# Patient Record
Sex: Female | Born: 1970 | Race: White | Hispanic: No | Marital: Married
Health system: Southern US, Community
[De-identification: ages and names within clinical notes are randomized; demographics above are authoritative.]

---

## 1997-06-21 ENCOUNTER — Encounter: Admission: RE | Admit: 1997-06-21 | Discharge: 1997-06-21 | Payer: Self-pay | Admitting: Obstetrics

## 1997-06-21 ENCOUNTER — Other Ambulatory Visit: Admission: RE | Admit: 1997-06-21 | Discharge: 1997-06-21 | Payer: Self-pay | Admitting: Obstetrics

## 1997-08-23 ENCOUNTER — Encounter: Admission: RE | Admit: 1997-08-23 | Discharge: 1997-08-23 | Payer: Self-pay | Admitting: Obstetrics

## 1997-08-29 ENCOUNTER — Emergency Department (HOSPITAL_COMMUNITY): Admission: EM | Admit: 1997-08-29 | Discharge: 1997-08-29 | Payer: Self-pay | Admitting: Emergency Medicine

## 1997-09-07 ENCOUNTER — Ambulatory Visit (HOSPITAL_COMMUNITY): Admission: RE | Admit: 1997-09-07 | Discharge: 1997-09-07 | Payer: Self-pay | Admitting: Internal Medicine

## 1997-09-12 ENCOUNTER — Ambulatory Visit (HOSPITAL_COMMUNITY): Admission: RE | Admit: 1997-09-12 | Discharge: 1997-09-12 | Payer: Self-pay | Admitting: Internal Medicine

## 1997-09-15 ENCOUNTER — Inpatient Hospital Stay (HOSPITAL_COMMUNITY): Admission: EM | Admit: 1997-09-15 | Discharge: 1997-09-16 | Payer: Self-pay | Admitting: Emergency Medicine

## 1997-10-11 ENCOUNTER — Other Ambulatory Visit: Admission: RE | Admit: 1997-10-11 | Discharge: 1997-10-11 | Payer: Self-pay | Admitting: Obstetrics

## 1997-10-11 ENCOUNTER — Encounter: Admission: RE | Admit: 1997-10-11 | Discharge: 1997-10-11 | Payer: Self-pay | Admitting: Obstetrics

## 1997-11-30 ENCOUNTER — Encounter: Payer: Self-pay | Admitting: Emergency Medicine

## 1997-11-30 ENCOUNTER — Emergency Department (HOSPITAL_COMMUNITY): Admission: EM | Admit: 1997-11-30 | Discharge: 1997-11-30 | Payer: Self-pay | Admitting: Emergency Medicine

## 1998-12-06 ENCOUNTER — Encounter: Admission: RE | Admit: 1998-12-06 | Discharge: 1998-12-06 | Payer: Self-pay | Admitting: Internal Medicine

## 1998-12-06 ENCOUNTER — Encounter: Payer: Self-pay | Admitting: Internal Medicine

## 1998-12-16 ENCOUNTER — Ambulatory Visit (HOSPITAL_COMMUNITY): Admission: RE | Admit: 1998-12-16 | Discharge: 1998-12-16 | Payer: Self-pay | Admitting: Internal Medicine

## 1998-12-16 ENCOUNTER — Encounter: Payer: Self-pay | Admitting: Internal Medicine

## 1998-12-19 ENCOUNTER — Encounter: Admission: RE | Admit: 1998-12-19 | Discharge: 1998-12-19 | Payer: Self-pay | Admitting: Obstetrics

## 1999-01-30 ENCOUNTER — Encounter: Admission: RE | Admit: 1999-01-30 | Discharge: 1999-01-30 | Payer: Self-pay | Admitting: Obstetrics

## 2000-05-17 ENCOUNTER — Emergency Department (HOSPITAL_COMMUNITY): Admission: EM | Admit: 2000-05-17 | Discharge: 2000-05-17 | Payer: Self-pay | Admitting: Emergency Medicine

## 2000-05-17 ENCOUNTER — Encounter: Payer: Self-pay | Admitting: Emergency Medicine

## 2001-07-28 ENCOUNTER — Encounter: Admission: RE | Admit: 2001-07-28 | Discharge: 2001-07-28 | Payer: Self-pay | Admitting: Obstetrics and Gynecology

## 2001-08-18 ENCOUNTER — Ambulatory Visit (HOSPITAL_COMMUNITY): Admission: RE | Admit: 2001-08-18 | Discharge: 2001-08-18 | Payer: Self-pay | Admitting: Obstetrics and Gynecology

## 2001-08-18 ENCOUNTER — Encounter: Payer: Self-pay | Admitting: Obstetrics and Gynecology

## 2004-06-12 ENCOUNTER — Ambulatory Visit (HOSPITAL_COMMUNITY): Admission: RE | Admit: 2004-06-12 | Discharge: 2004-06-12 | Payer: Self-pay | Admitting: Obstetrics and Gynecology

## 2004-08-05 ENCOUNTER — Ambulatory Visit: Payer: Self-pay | Admitting: Obstetrics and Gynecology

## 2004-09-02 ENCOUNTER — Ambulatory Visit: Payer: Self-pay | Admitting: Obstetrics & Gynecology

## 2004-11-03 ENCOUNTER — Ambulatory Visit: Payer: Self-pay | Admitting: Obstetrics & Gynecology

## 2004-11-03 ENCOUNTER — Observation Stay (HOSPITAL_COMMUNITY): Admission: RE | Admit: 2004-11-03 | Discharge: 2004-11-03 | Payer: Self-pay | Admitting: Obstetrics & Gynecology

## 2004-11-07 ENCOUNTER — Ambulatory Visit: Payer: Self-pay | Admitting: Family Medicine

## 2004-11-09 ENCOUNTER — Ambulatory Visit: Payer: Self-pay | Admitting: Family Medicine

## 2004-11-09 ENCOUNTER — Inpatient Hospital Stay (HOSPITAL_COMMUNITY): Admission: AD | Admit: 2004-11-09 | Discharge: 2004-11-09 | Payer: Self-pay | Admitting: Family Medicine

## 2004-11-10 ENCOUNTER — Ambulatory Visit: Payer: Self-pay | Admitting: *Deleted

## 2004-11-10 ENCOUNTER — Inpatient Hospital Stay (HOSPITAL_COMMUNITY): Admission: AD | Admit: 2004-11-10 | Discharge: 2004-11-12 | Payer: Self-pay | Admitting: *Deleted

## 2004-11-18 ENCOUNTER — Ambulatory Visit: Payer: Self-pay | Admitting: *Deleted

## 2004-11-25 ENCOUNTER — Ambulatory Visit: Payer: Self-pay | Admitting: Obstetrics and Gynecology

## 2004-12-04 ENCOUNTER — Ambulatory Visit: Payer: Self-pay | Admitting: Obstetrics and Gynecology

## 2004-12-16 ENCOUNTER — Ambulatory Visit: Payer: Self-pay | Admitting: Obstetrics and Gynecology

## 2004-12-23 ENCOUNTER — Ambulatory Visit: Payer: Self-pay | Admitting: *Deleted

## 2005-01-01 ENCOUNTER — Ambulatory Visit: Payer: Self-pay | Admitting: Obstetrics and Gynecology

## 2005-04-06 ENCOUNTER — Ambulatory Visit (HOSPITAL_COMMUNITY): Admission: RE | Admit: 2005-04-06 | Discharge: 2005-04-06 | Payer: Self-pay | Admitting: Obstetrics

## 2005-05-20 ENCOUNTER — Encounter: Payer: Self-pay | Admitting: General Surgery

## 2005-09-20 ENCOUNTER — Emergency Department: Payer: Self-pay | Admitting: Unknown Physician Specialty

## 2005-10-02 ENCOUNTER — Emergency Department: Payer: Self-pay | Admitting: Emergency Medicine

## 2005-11-10 ENCOUNTER — Ambulatory Visit (HOSPITAL_COMMUNITY): Admission: RE | Admit: 2005-11-10 | Discharge: 2005-11-10 | Payer: Self-pay | Admitting: Obstetrics

## 2006-05-20 ENCOUNTER — Encounter: Admission: RE | Admit: 2006-05-20 | Discharge: 2006-08-18 | Payer: Self-pay | Admitting: Obstetrics

## 2006-06-20 ENCOUNTER — Emergency Department: Payer: Self-pay | Admitting: Emergency Medicine

## 2007-05-28 ENCOUNTER — Emergency Department: Payer: Self-pay | Admitting: Unknown Physician Specialty

## 2007-10-04 ENCOUNTER — Emergency Department: Payer: Self-pay | Admitting: Emergency Medicine

## 2008-04-02 ENCOUNTER — Emergency Department: Payer: Self-pay | Admitting: Emergency Medicine

## 2009-01-17 IMAGING — CT CT ABD-PELV W/O CM
1 of 2 series · 15 of 32 positions shown, 19 images · non-contrast
Comparison: none

REASON FOR EXAM: (1) llq pain; (2) llq pain
COMMENTS:

PROCEDURE:     CT  - CT ABDOMEN AND PELVIS W[DATE]  [DATE]
RESULT:     Comparison: No available comparison exam.
TECHNIQUE: CT examination of the abdomen and pelvis was performed without
contrast. Collimation is 3 mm.

[Series 2: abd/pelvis · axial · 0.72mm/px · z∈[+880,+1300]mm · 15 of 158 slices shown, 19 images]
[im 12/158  soft-tissue]
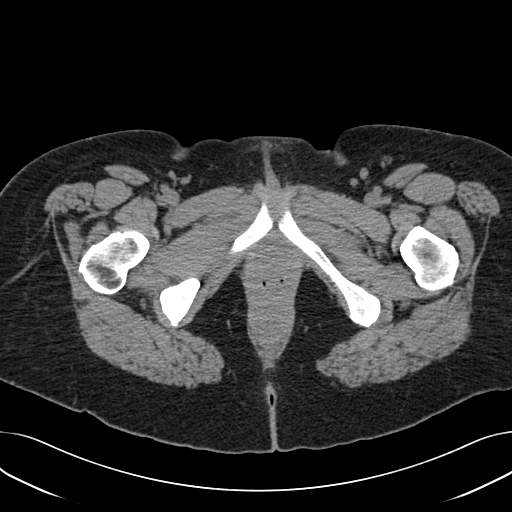
[im 12/158  bone]
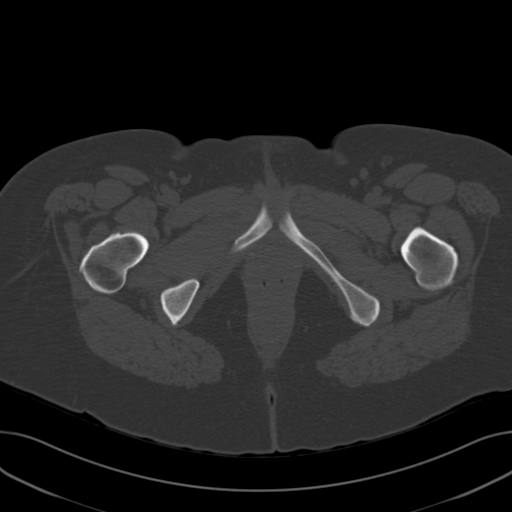
[im 23/158  soft-tissue]
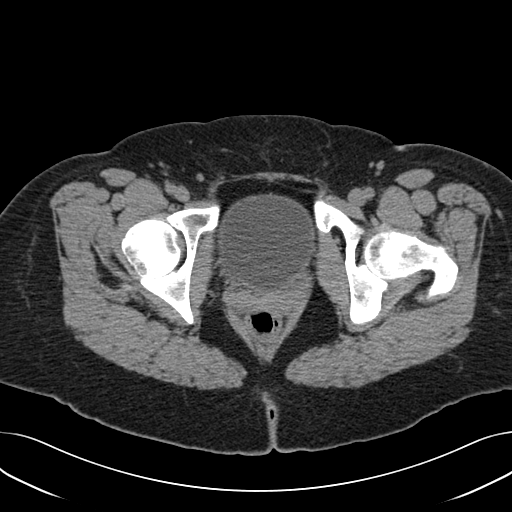
[im 34/158  soft-tissue]
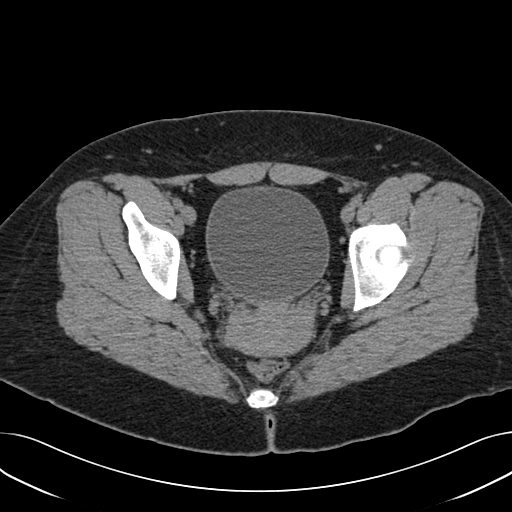
[im 45/158  soft-tissue]
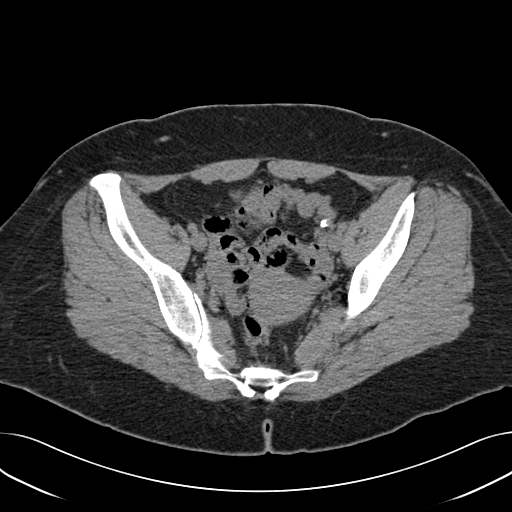
[im 57/158  soft-tissue]
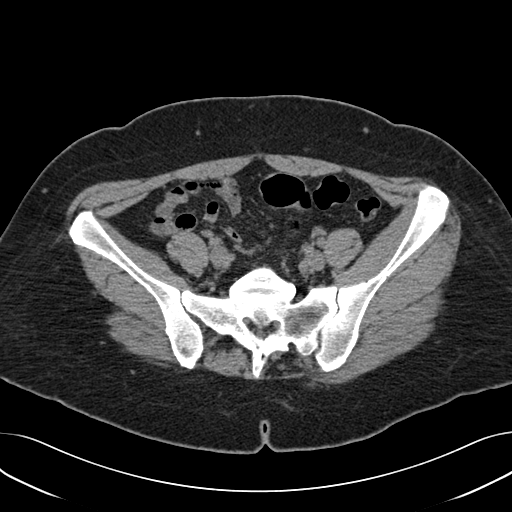
[im 68/158  soft-tissue]
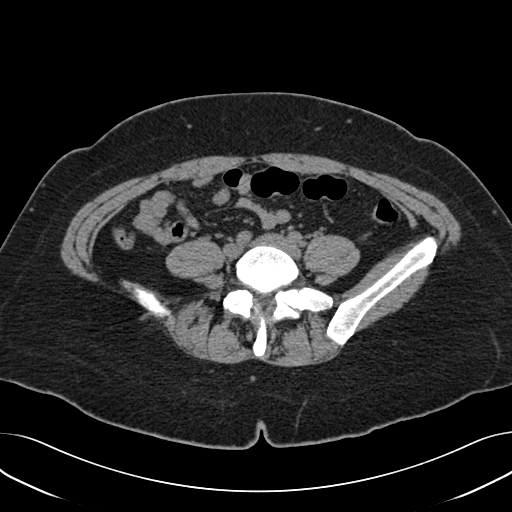
[im 79/158  soft-tissue]
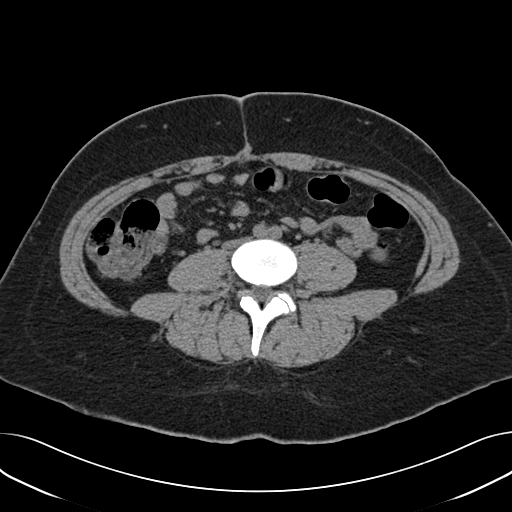
[im 90/158  soft-tissue]
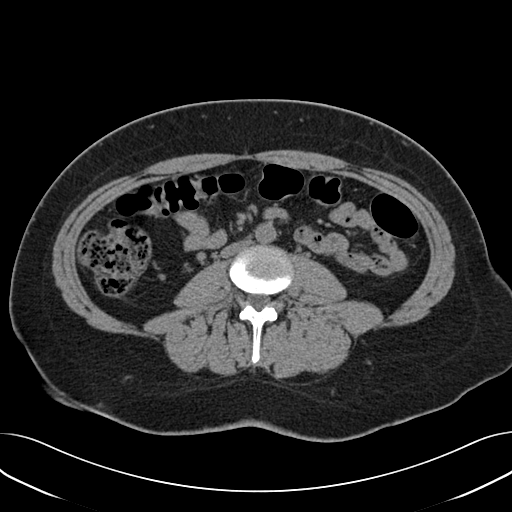
[im 101/158  soft-tissue]
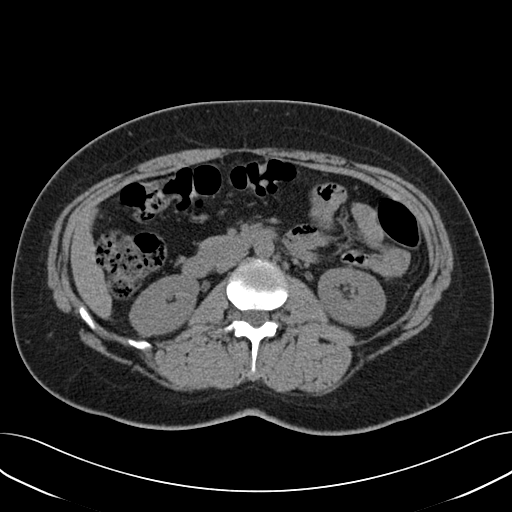
[im 101/158  bone]
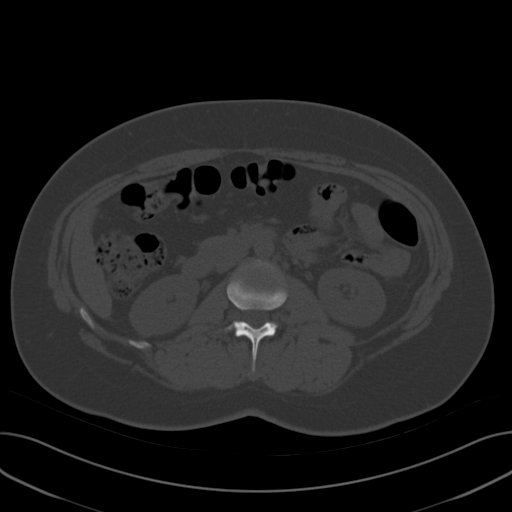
[im 113/158  soft-tissue]
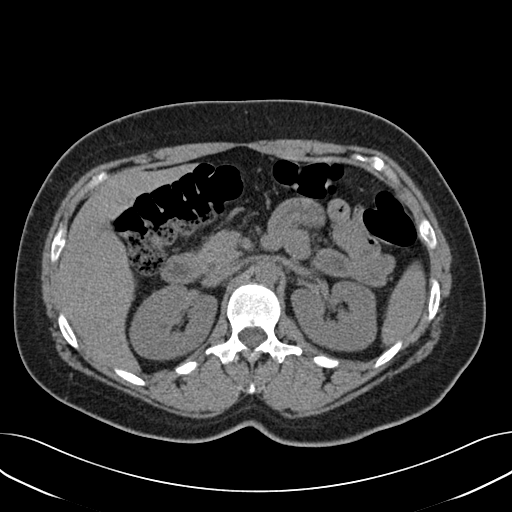
[im 124/158  soft-tissue]
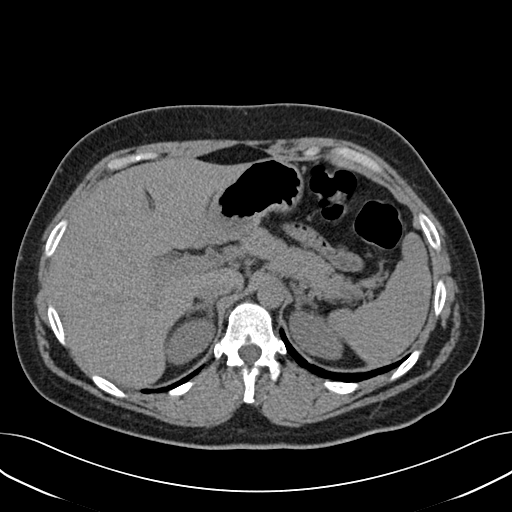
[im 135/158  soft-tissue]
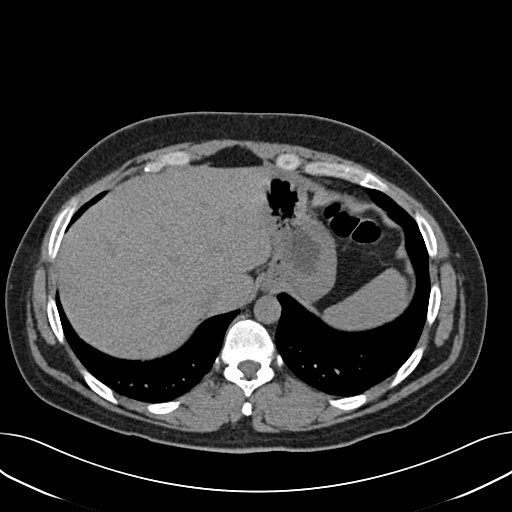
[im 135/158  lung]
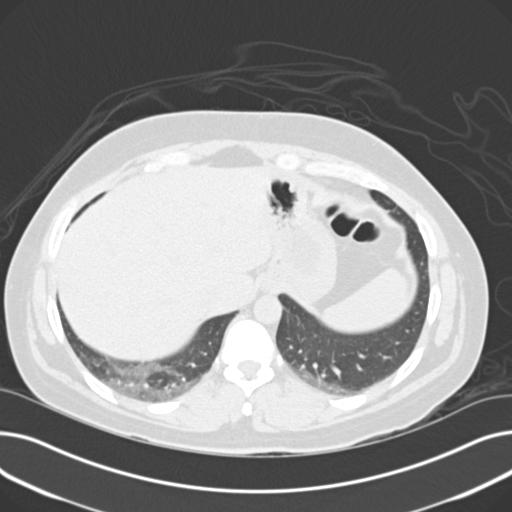
[im 141/158  lung]
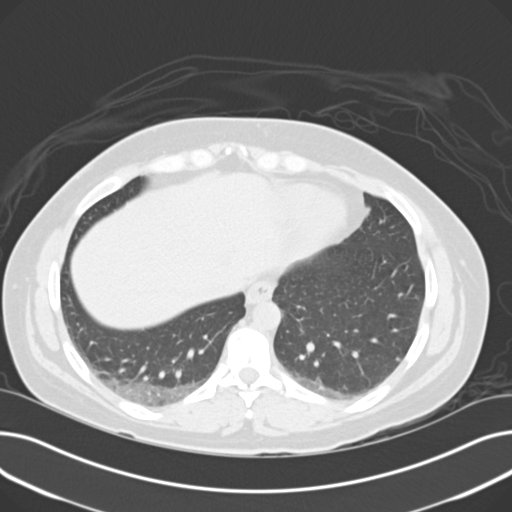
[im 146/158  soft-tissue]
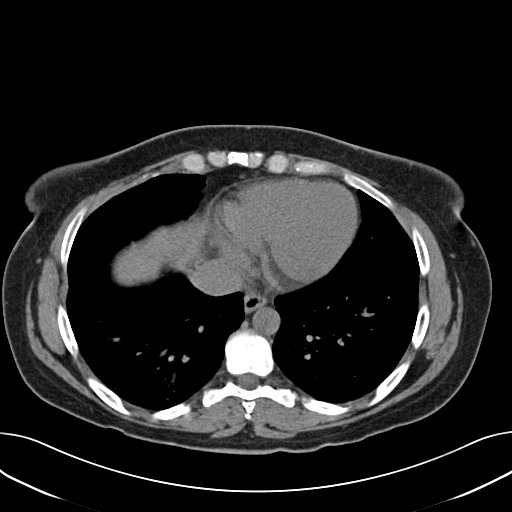
[im 146/158  lung]
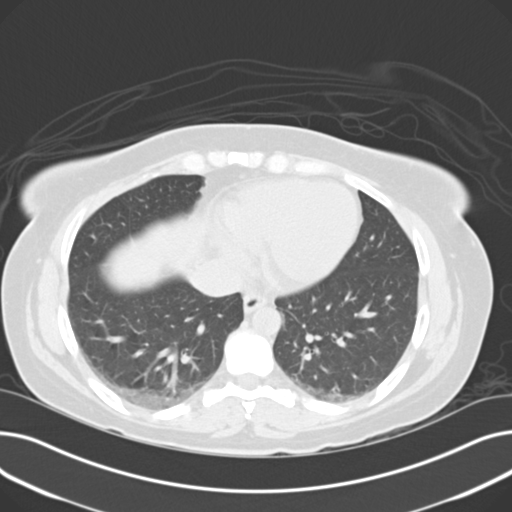
[im 152/158  lung]
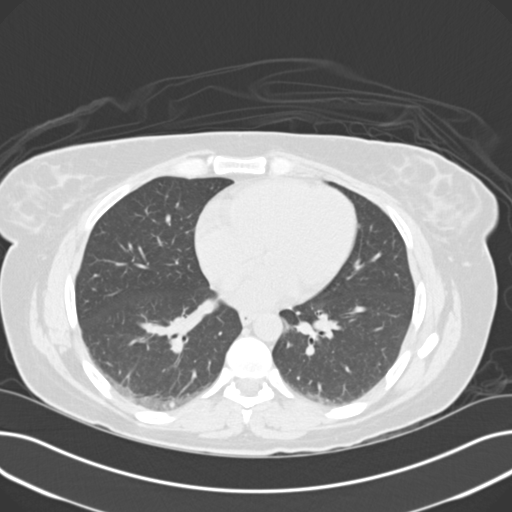

[15 of 32 positions shown; findings below may reference images not displayed]

FINDINGS: Limited evaluation of the lung bases shows mild bibasilar atelectasis.

Evaluation of the abdominal organs, bowels, and vessels is limited without
contrast. Patient is status post cholecystectomy. The liver, spleen,
pancreas, and right adrenal gland are grossly unremarkable. There may be an
1 cm left adrenal adenoma. No renal or ureteral stone is noted. There is no
hydronephrosis. There is an 8 mm exophytic right lower pole renal cyst.

There is no dilatation of the bowels. The appendix is unremarkable. There is
no significant intra abdominal or pelvic fat stranding. There is no
intraperitoneal free air. There is no significant free fluid. There are no
enlarged abdominal pelvic lymph nodes. The uterus is present. Bilateral
tubal ligation clips are noted.

Transitional vertebra is noted at the lumbosacral junction.
IMPRESSION: 1. No renal or ureteral stone.
2. Evaluation of the abdominal organs, bowels, and vessels is otherwise
limited without contrast. The appendix is unremarkable. There is no bowel
obstruction. There is no significant intra-abdominal or pelvic fat
stranding.

Preliminary report was faxed to the emergency room by the night radiologist
shortly after the study was performed.

## 2009-05-26 IMAGING — CT CT ABD-PELV W/ CM
1 of 2 series · 16 of 32 positions shown, 20 images · non-contrast
Comparison: none

REASON FOR EXAM: RLQ pain
COMMENTS:

PROCEDURE:     CT  - CT ABDOMEN / PELVIS  W  - October 04, 2007  [DATE]
RESULT:     The patient has a history of cholecystectomy, right lower
quadrant pain.
TECHNIQUE: IV and oral contrast enhanced CT of the abdomen and pelvis is
obtained.
Comparison is made to prior CT of 05/28/2007.

[Series 2: appendicitis · axial · 0.72mm/px · z∈[-274,+158]mm · 16 of 158 slices shown, 20 images]
[im 7/158  soft-tissue]
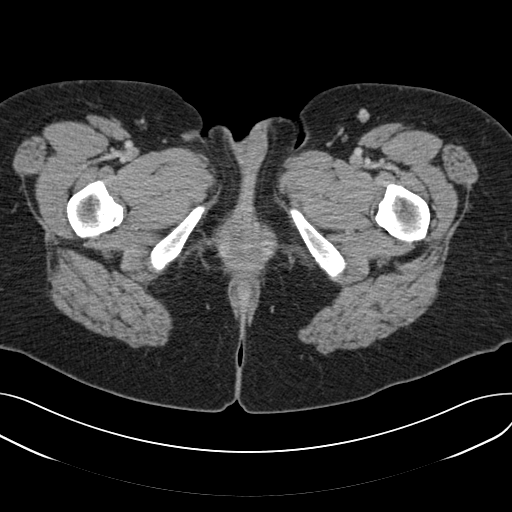
[im 7/158  bone]
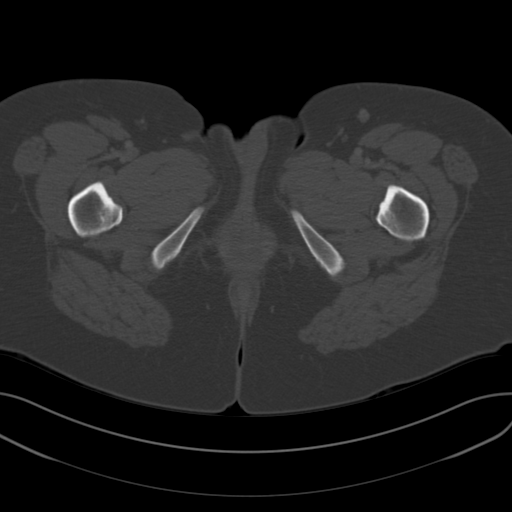
[im 19/158  soft-tissue]
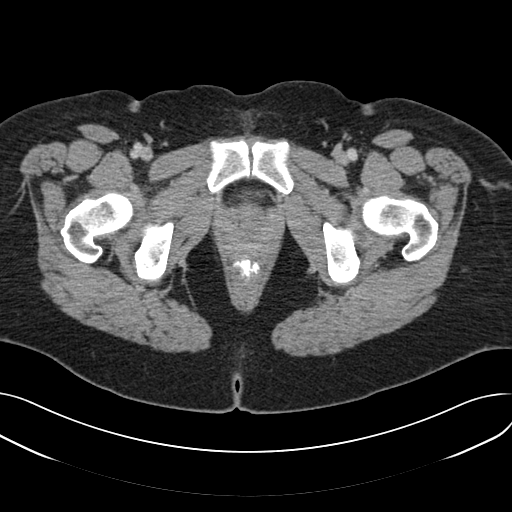
[im 31/158  soft-tissue]
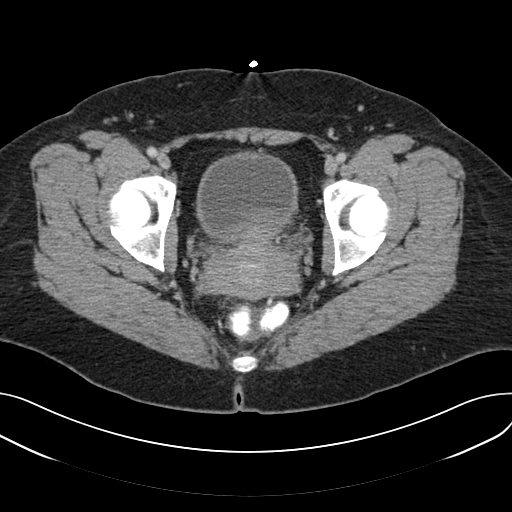
[im 43/158  soft-tissue]
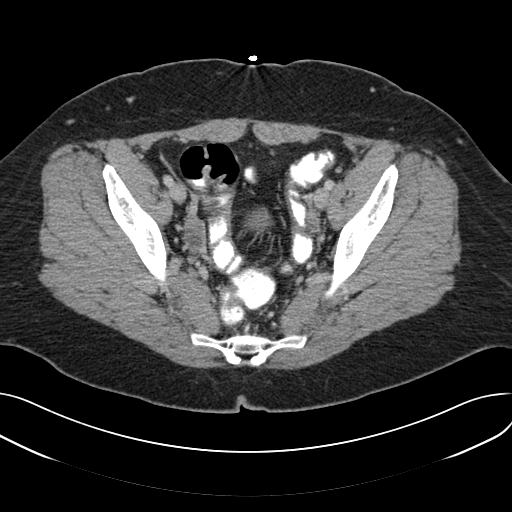
[im 55/158  soft-tissue]
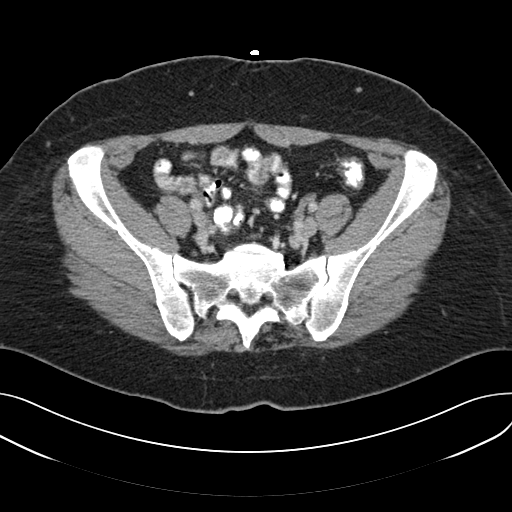
[im 61/158  soft-tissue]
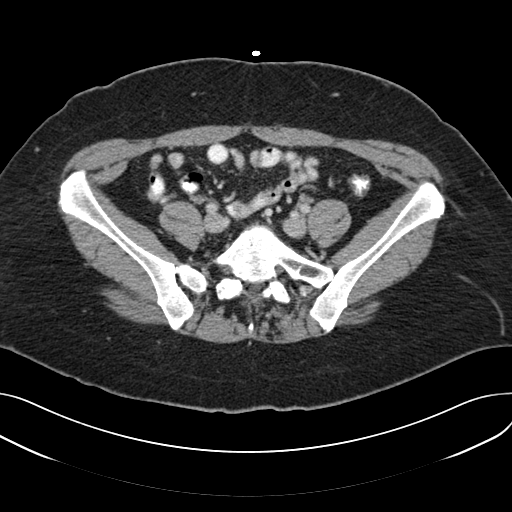
[im 73/158  soft-tissue]
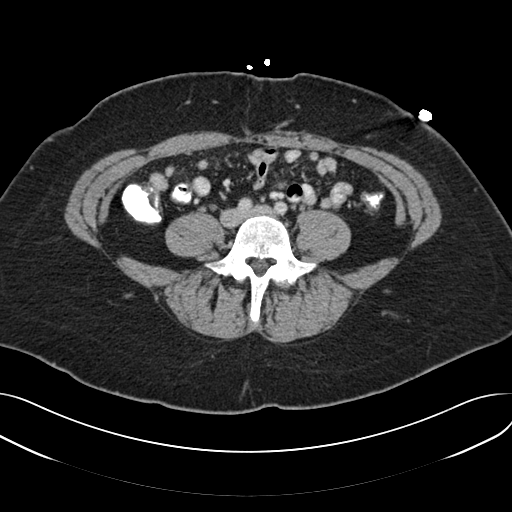
[im 85/158  soft-tissue]
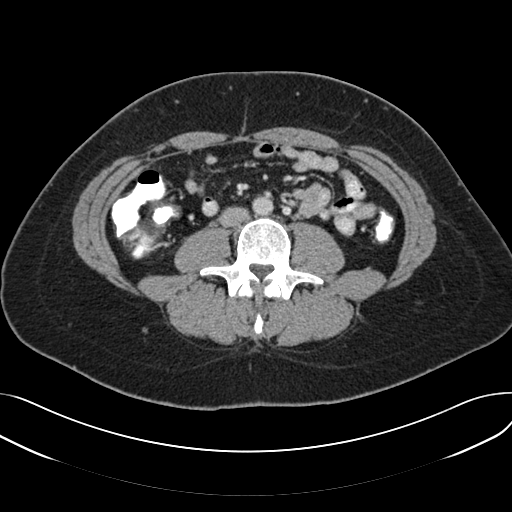
[im 97/158  soft-tissue]
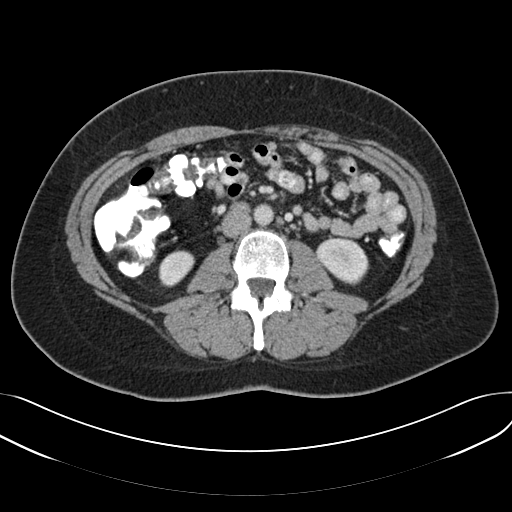
[im 97/158  bone]
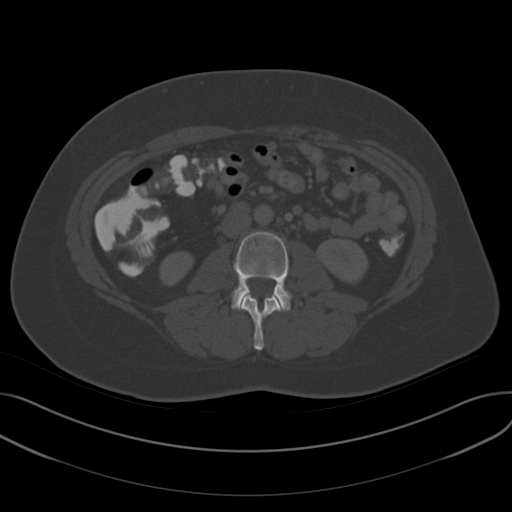
[im 103/158  soft-tissue]
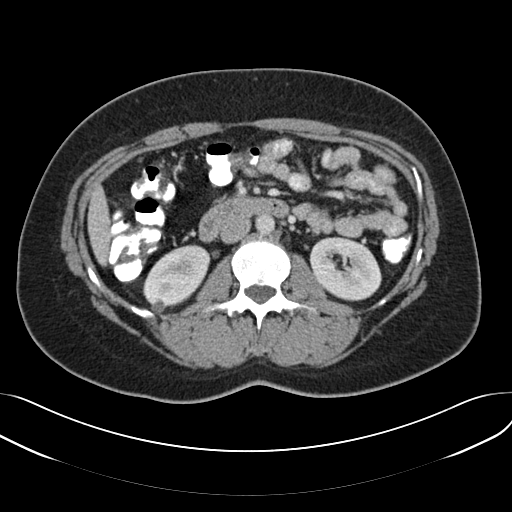
[im 115/158  soft-tissue]
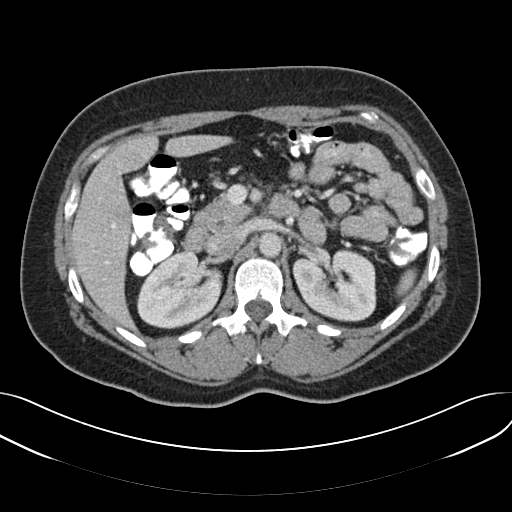
[im 127/158  soft-tissue]
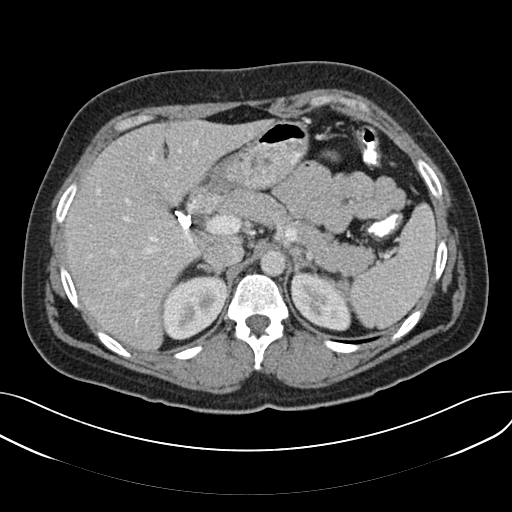
[im 133/158  lung]
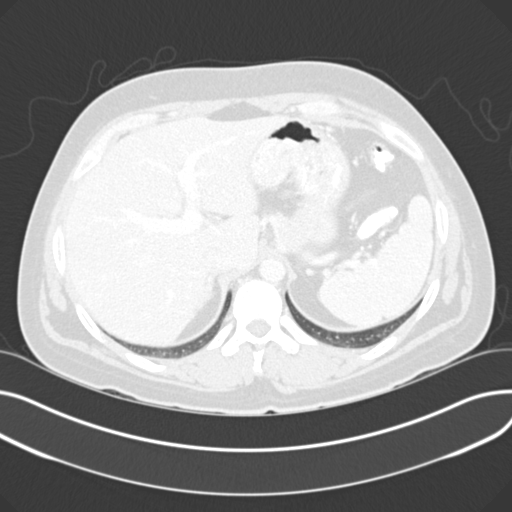
[im 139/158  soft-tissue]
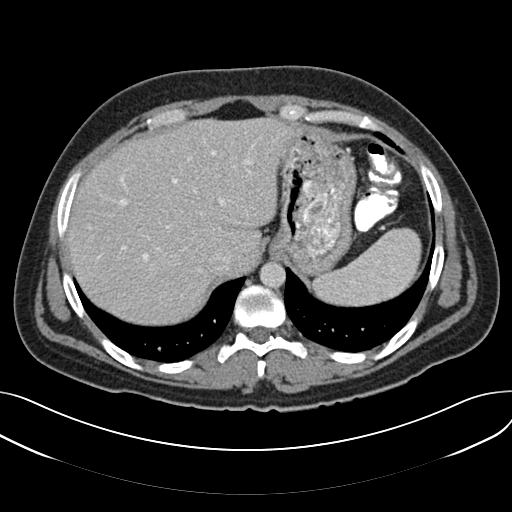
[im 139/158  lung]
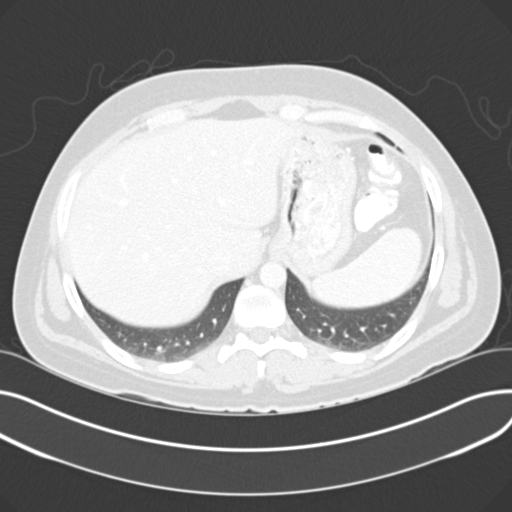
[im 145/158  lung]
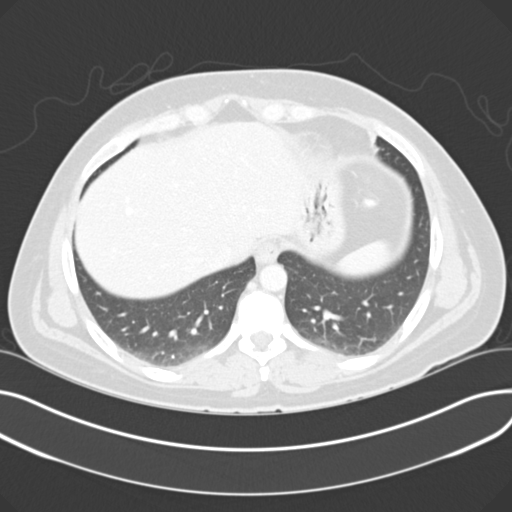
[im 151/158  soft-tissue]
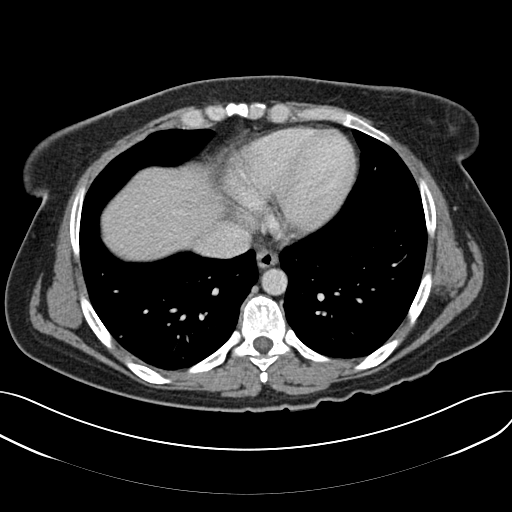
[im 151/158  lung]
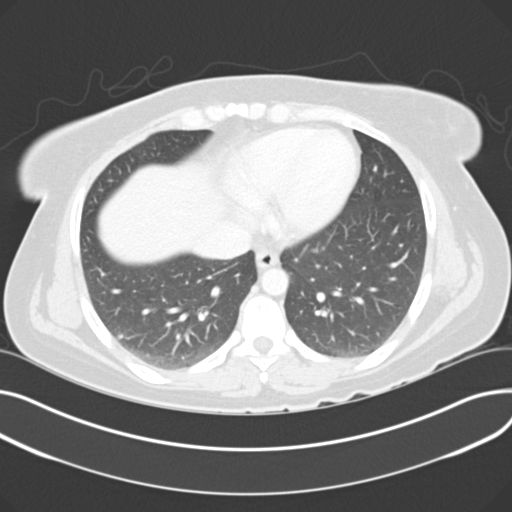

[16 of 32 positions shown; findings below may reference images not displayed]

FINDINGS: The liver and spleen are normal. The pancreas is normal. The
adrenals are normal. No focal renal abnormalities are identified. The
appendix is normal. No bowel distention is noted. A small pelvic fluid
collection is noted.
IMPRESSION: 1.     Small amount of pelvic fluid is noted. Ultrasound may prove useful
for further evaluation.
2.     No acute abnormality is otherwise noted.

## 2010-10-22 ENCOUNTER — Other Ambulatory Visit: Payer: Self-pay | Admitting: Family Medicine

## 2010-10-22 DIAGNOSIS — N6310 Unspecified lump in the right breast, unspecified quadrant: Secondary | ICD-10-CM

## 2010-11-04 ENCOUNTER — Ambulatory Visit
Admission: RE | Admit: 2010-11-04 | Discharge: 2010-11-04 | Disposition: A | Discharge status: Home / Self Care | Payer: Medicaid Other | Source: Ambulatory Visit | Attending: Family Medicine | Admitting: Family Medicine

## 2010-11-04 DIAGNOSIS — N6310 Unspecified lump in the right breast, unspecified quadrant: Secondary | ICD-10-CM

## 2020-01-25 ENCOUNTER — Other Ambulatory Visit: Payer: Self-pay

## 2020-01-25 ENCOUNTER — Emergency Department
Admission: EM | Admit: 2020-01-25 | Discharge: 2020-01-25 | Disposition: A | Discharge status: Home / Self Care | Payer: Self-pay | Attending: Emergency Medicine | Admitting: Emergency Medicine

## 2020-01-25 DIAGNOSIS — K029 Dental caries, unspecified: Secondary | ICD-10-CM | POA: Insufficient documentation

## 2020-01-25 DIAGNOSIS — K0889 Other specified disorders of teeth and supporting structures: Secondary | ICD-10-CM | POA: Insufficient documentation

## 2020-01-25 MED ORDER — CLINDAMYCIN HCL 150 MG PO CAPS
300.0000 mg | ORAL_CAPSULE | Freq: Once | ORAL | Status: AC
  Administered 2020-01-25: 300 mg via ORAL
  Filled 2020-01-25: qty 2

## 2020-01-25 MED ORDER — LIDOCAINE VISCOUS HCL 2 % MT SOLN
15.0000 mL | Freq: Once | OROMUCOSAL | Status: AC
  Administered 2020-01-25: 15 mL via OROMUCOSAL
  Filled 2020-01-25: qty 15

## 2020-01-25 MED ORDER — IBUPROFEN 600 MG PO TABS: 600.0000 mg | ORAL_TABLET | Freq: Three times a day (TID) | ORAL | 0 refills | Status: AC | PRN

## 2020-01-25 MED ORDER — CLINDAMYCIN HCL 150 MG PO CAPS: 300.0000 mg | ORAL_CAPSULE | Freq: Three times a day (TID) | ORAL | 0 refills | Status: AC

## 2020-01-25 MED ORDER — LIDOCAINE VISCOUS HCL 2 % MT SOLN: 15.0000 mL | OROMUCOSAL | 0 refills | Status: AC | PRN

## 2020-01-25 MED ORDER — IBUPROFEN 600 MG PO TABS
600.0000 mg | ORAL_TABLET | Freq: Once | ORAL | Status: AC
  Administered 2020-01-25: 600 mg via ORAL
  Filled 2020-01-25: qty 1

## 2020-01-25 NOTE — ED Triage Notes (Signed)
Pt to ED reporting right upper dental pain x 2 days ago with swelling. Hx of the same and PO antibiotics in the past. No fevers at home.

## 2020-01-25 NOTE — Discharge Instructions (Addendum)
OPTIONS FOR DENTAL FOLLOW UP CARE ° °[place] [facility] and [facility] - Local Safety Net Dental Clinics °http://www.ncdhhs.gov/dph/oralhealth/services/safetynetclinics.htm °  °[facility] (336-562-3123) ° °[facility] (919-933-9087) ° °[facility] (919-663-1744 ext [phone]) ° °[facility] ([phone]) ° °[facility] ([phone]) °This clinic caters to the indigent population and is on a lottery system. °Location: °[facility], [address], [place] °Clinic Hours: °Wednesdays from 6pm - 9pm, patients seen by a lottery system. °For dates, call or go to www.med.[facility].edu/[facility]/patients/Dental-[facility] °Services: °Cleanings, fillings and simple extractions. °Payment Options: °DENTAL WORK IS FREE OF CHARGE. Bring proof of income or support. °Best way to get seen: °Arrive at 5:15 pm - this is a lottery, NOT first come/first serve, so arriving earlier will not increase your chances of being seen. °  °  °[facility] °[phone] °Select option 1 for emergencies °  °Location: °[facility], [address], [place] °Clinic Hours: °No walk-ins accepted - call the day before to schedule an appointment. °Check in times are 9:30 am and 1:30 pm. °Services: °Simple extractions, temporary fillings, pulpectomy/pulp debridement, uncomplicated abscess drainage. °Payment Options: °PAYMENT IS DUE AT THE TIME OF SERVICE.  Fee is usually $100-200, additional surgical procedures (e.g. abscess drainage) may be extra. °Cash, checks, Visa/MasterCard accepted.  Can file Medicaid if patient is covered for dental - patient should call case worker to check. °No discount for [facility] patients. °Best way to get seen: °MUST call the day before and get onto the schedule. Can usually be seen the next 1-2 days. No walk-ins accepted. °  °  °[facility] °[phone] °   °Location: °[facility], [address], [place] °Clinic Hours: °M, W, Th, F 8am or 1:30pm, Tues 9a or 1:30 - first come/first served. °Services: °Simple extractions, temporary fillings, uncomplicated abscess drainage.  You do not need to be an [place] resident. °Payment Options: °PAYMENT IS DUE AT THE TIME OF SERVICE. °Dental insurance, otherwise sliding scale - bring proof of income or support. °Depending on income and treatment needed, cost is usually $50-200. °Best way to get seen: °Arrive early as it is first come/first served. °  °  °[facility] °[phone] °  °Location: °[address] °Clinic Hours: °Mon-Thu 8a-5p °Services: °Most basic dental services including extractions and fillings. °Payment Options: °PAYMENT IS DUE AT THE TIME OF SERVICE. °Sliding scale, up to 50% off - bring proof if income or support. °Medicaid with dental option accepted. °Best way to get seen: °Call to schedule an appointment, can usually be seen within 2 weeks OR they will try to see walk-ins - show up at 8a or 2p (you may have to wait). °  °  °[facility] °919-245-2435 °[place] RESIDENTS ONLY °  °Location: °[facility], [address], [place], [place] [address] °Clinic Hours: By appointment only. °Monday - Thursday 8am-5pm, Friday 8am-12pm °Services: Cleanings, fillings, extractions. °Payment Options: °PAYMENT IS DUE AT THE TIME OF SERVICE. °Cash, Visa or MasterCard. Sliding scale - $30 minimum per service. °Best way to get seen: °Come in to office, complete packet and make an appointment - need proof of income °or support monies for each household member and proof of [place] residence. °Usually takes about a month to get in. °  °  °[facility] °[phone] °  °Location: °[address]   [place] °Clinic Hours: Walk-in Urgent Care Dental Services are offered Monday-Friday  mornings only. °The numbers of emergencies accepted daily is limited to the number of °providers available. °Maximum 15 - Mondays, Wednesdays & Thursdays °Maximum 10 - Tuesdays & Fridays °Services: °You do not need to be a [place] resident to be seen for a dental emergency. °Emergencies are defined as pain, swelling, abnormal bleeding, or dental trauma. Walkins will receive x-rays if needed. °NOTE: Dental cleaning is not an emergency. °Payment Options: °PAYMENT IS DUE AT THE TIME OF SERVICE. °Minimum co-pay is $40.00 for uninsured patients. °Minimum co-pay is $3.00 for Medicaid with dental coverage. °Dental Insurance is accepted and must be presented at time of visit. °Medicare does not cover dental. °Forms of payment: Cash, credit card, checks. °Best way to get seen: °If not previously registered with the clinic, walk-in dental registration begins at 7:15 am and is on a first come/first serve basis. °If previously registered with the clinic, call to make an appointment. °  °  °[facility] °[phone] °[place] RESIDENTS ONLY °  °Location: °[address], [place], [place] °Clinic Hours: °Mon-Thu 10a-2p °Services: Extractions only! °Payment Options: °FREE (donations accepted) - bring proof of income or support °Best way to get seen: °Call and schedule an appointment OR come at 8am on the 1st Monday of every month (except for holidays) when it is first come/first served. °  °  °[facility] °[phone] °  °Location: °[address], [place] °Clinic Hours: °Friday mornings °Services, Payment Options, Best way to get seen: °Call for info °

## 2020-01-26 NOTE — ED Provider Notes (Signed)
[facility] Emergency Department Provider Note  ____________________________________________   Event Date/Time   First MD Initiated Contact with Patient 01/25/20 2053     (approximate)  I have reviewed the triage vital signs and the nursing notes.   HISTORY  Chief Complaint Dental Pain  HPI Tonya Donovan is a 50 y.o. female who reports to the emergency department for evaluation of pain to the upper right dental region for the last 2 days.  She notes some mild swelling to the region.  She has had history of similar in the past that required antibiotics.  She denies any fever, facial swelling, facial erythema, difficulty breathing or swallowing.  She has been treating at home with ibuprofen with minimal relief.         No past medical history on file.  There are no problems to display for this patient.     Prior to Admission medications   Medication Sig Start Date End Date Taking? Authorizing Provider  clindamycin (CLEOCIN) 150 MG capsule Take 2 capsules (300 mg total) by mouth 3 (three) times daily for 10 days. 01/25/20 02/04/20 Yes Quamir Willemsen, Ruben Gottron, PA  ibuprofen (ADVIL) 600 MG tablet Take 1 tablet (600 mg total) by mouth every 8 (eight) hours as needed. 01/25/20  Yes Lucy Chris, PA  lidocaine (XYLOCAINE) 2 % solution Use as directed 15 mLs in the mouth or throat as needed for mouth pain. 01/25/20  Yes Lucy Chris, PA    Allergies Patient has no known allergies.  No family history on file.  Social History    Review of Systems Constitutional: No fever/chills Eyes: No visual changes. ENT: + Dental pain, no sore throat. Cardiovascular: Denies chest pain. Respiratory: Denies shortness of breath. Gastrointestinal: No abdominal pain.  No nausea, no vomiting.  No diarrhea.  No constipation. Genitourinary: Negative for dysuria. Musculoskeletal: Negative for back pain. Skin: Negative for rash. Neurological: Negative for headaches,  focal weakness or numbness.  ____________________________________________   PHYSICAL EXAM:  VITAL SIGNS: ED Triage Vitals  Enc Vitals Group     BP 01/25/20 2150 (!) 143/88     Pulse Rate 01/25/20 2019 (!) 102     Resp 01/25/20 2019 16     Temp 01/25/20 2019 98.6 F (37 C)     Temp Source 01/25/20 2019 Oral     SpO2 01/25/20 2019 99 %     Weight --      Height 01/25/20 2020 5\' 6"  (1.676 m)     Head Circumference --      Peak Flow --      Pain Score 01/25/20 2020 6     Pain Loc --      Pain Edu? --      Excl. in GC? --    Constitutional: Alert and oriented. Well appearing and in no acute distress. Eyes: Conjunctivae are normal. PERRL. EOMI. Head: Atraumatic. Nose: No congestion/rhinnorhea. Mouth/Throat: There is tenderness to palpation of multiple molars on the right upper jaw.  There is mild surrounding swelling without any fluctuance or obvious abscess.  None of the swelling crosses midline.  There is no cervical lymphadenopathy. mucous membranes are moist.  Oropharynx non-erythematous. Neck: No stridor.   Cardiovascular: Normal rate, regular rhythm. Grossly normal heart sounds.  Good peripheral circulation. Respiratory: Normal respiratory effort.  No retractions. Lungs CTAB. Neurologic:  Normal speech and language. No gross focal neurologic deficits are appreciated. No gait instability. Skin:  Skin is warm, dry and intact. No  rash noted. Psychiatric: Mood and affect are normal. Speech and behavior are normal.  ____________________________________________   INITIAL IMPRESSION / ASSESSMENT AND PLAN / ED COURSE  As part of my medical decision making, I reviewed the following data within the electronic MEDICAL RECORD NUMBER Nursing notes reviewed and incorporated and Notes from prior ED visits        Patient is a 50 year old female who presents to the ER for evaluation of right upper dental pain has been present for the last 2 days with some mild swelling associated.  She  denies fever or other systemic symptoms.  She has history of seen that required p.o. antibiotics for treatment.  On exam, there is some mild swelling around 2 of the right upper molars without any fluctuance or obvious abscess.  None of the swelling crosses midline and there are no other concerning findings for systemic disease.  We will begin treatment with outpatient antibiotics with clindamycin and treat symptoms with ibuprofen and lidocaine mouthwash.  The patient is amenable with this plan and she was instructed red flag symptoms that should prompt her to return.  Patient is stable at time for outpatient therapy.      ____________________________________________   FINAL CLINICAL IMPRESSION(S) / ED DIAGNOSES  Final diagnoses:  Dental caries  Pain, dental     ED Discharge Orders         Ordered    clindamycin (CLEOCIN) 150 MG capsule  3 times daily        01/25/20 2130    lidocaine (XYLOCAINE) 2 % solution  As needed        01/25/20 2130    ibuprofen (ADVIL) 600 MG tablet  Every 8 hours PRN        01/25/20 2130          *Please note:  Tonya Donovan was evaluated in Emergency Department on 01/26/2020 for the symptoms described in the history of present illness. She was evaluated in the context of the global COVID-19 pandemic, which necessitated consideration that the patient might be at risk for infection with the SARS-CoV-2 virus that causes COVID-19. Institutional protocols and algorithms that pertain to the evaluation of patients at risk for COVID-19 are in a state of rapid change based on information released by regulatory bodies including the CDC and federal and state organizations. These policies and algorithms were followed during the patient's care in the ED.  Some ED evaluations and interventions may be delayed as a result of limited staffing during and the pandemic.*   Note:  This document was prepared using Dragon voice recognition software and may include unintentional  dictation errors.    Lucy Chris, PA 01/26/20 2243    Phineas Semen, MD 01/26/20 [id number]
